# Patient Record
Sex: Female | Born: 1995 | Hispanic: Yes | Marital: Single | Smoking: Never smoker
Health system: Southern US, Community
[De-identification: ages and names within clinical notes are randomized; demographics above are authoritative.]

## PROBLEM LIST (undated history)

## (undated) ENCOUNTER — Inpatient Hospital Stay (HOSPITAL_COMMUNITY): Payer: Self-pay

## (undated) DIAGNOSIS — Z789 Other specified health status: Secondary | ICD-10-CM

## (undated) HISTORY — PX: NO PAST SURGERIES: SHX2092

---
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
## 2017-09-21 ENCOUNTER — Inpatient Hospital Stay (HOSPITAL_COMMUNITY)
Admission: RE | Admit: 2017-09-21 | Discharge: 2017-09-21 | Disposition: A | Discharge status: Home / Self Care | Payer: Medicaid Other | Source: Ambulatory Visit | Attending: Family Medicine | Admitting: Family Medicine

## 2017-09-21 ENCOUNTER — Encounter (HOSPITAL_COMMUNITY): Payer: Self-pay

## 2017-09-21 DIAGNOSIS — Z3A01 Less than 8 weeks gestation of pregnancy: Secondary | ICD-10-CM

## 2017-09-21 DIAGNOSIS — O219 Vomiting of pregnancy, unspecified: Secondary | ICD-10-CM

## 2017-09-21 DIAGNOSIS — O26891 Other specified pregnancy related conditions, first trimester: Secondary | ICD-10-CM | POA: Insufficient documentation

## 2017-09-21 DIAGNOSIS — K5901 Slow transit constipation: Secondary | ICD-10-CM

## 2017-09-21 HISTORY — DX: Other specified health status: Z78.9

## 2017-09-21 LAB — URINALYSIS, ROUTINE W REFLEX MICROSCOPIC
BACTERIA UA: NONE SEEN
BILIRUBIN URINE: NEGATIVE
GLUCOSE, UA: NEGATIVE mg/dL
HGB URINE DIPSTICK: NEGATIVE
KETONES UR: NEGATIVE mg/dL
LEUKOCYTES UA: NEGATIVE
NITRITE: NEGATIVE
PH: 7 (ref 5.0–8.0)
Protein, ur: 100 mg/dL — AB
Specific Gravity, Urine: 1.017 (ref 1.005–1.030)

## 2017-09-21 LAB — POCT PREGNANCY, URINE: Preg Test, Ur: POSITIVE — AB

## 2017-09-21 MED ORDER — METOCLOPRAMIDE HCL 10 MG PO TABS: 10.0000 mg | ORAL_TABLET | Freq: Four times a day (QID) | ORAL | 0 refills | Status: AC | PRN

## 2017-09-21 MED ORDER — POLYETHYLENE GLYCOL 3350 17 GM/SCOOP PO POWD: 17.0000 g | Freq: Every day | ORAL | 0 refills | Status: AC

## 2017-09-21 NOTE — MAU Provider Note (Signed)
History     CSN: [id number]  Arrival date and time: 09/21/17 [id number]   First Provider Initiated Contact with Patient 09/21/17 2055      Chief Complaint  Patient presents with  . Constipation   G1 @[redacted]w[redacted]d  here with N/V and constipation. N/V started 3 days ago. She is vomiting about 3 times a day. She is able to tolerate some fluids. No BM in 2 days. Denies VB or pain.    OB History    Gravida Para Term Preterm AB Living   1             SAB TAB Ectopic Multiple Live Births                  Past Medical History:  Diagnosis Date  . Medical history non-contributory     Past Surgical History:  Procedure Laterality Date  . NO PAST SURGERIES      Family History  Problem Relation Age of Onset  . Diabetes Mother   . Asthma Brother     Social History   Tobacco Use  . Smoking status: Never Smoker  . Smokeless tobacco: Never Used  Substance Use Topics  . Alcohol use: No  . Drug use: No    Allergies:  Allergies  Allergen Reactions  . Pineapple Hives    No medications prior to admission.    Review of Systems  Gastrointestinal: Positive for constipation, nausea and vomiting. Negative for abdominal pain.  Genitourinary: Negative for vaginal bleeding.   Physical Exam   Blood pressure 117/68, pulse 77, temperature 97.6 F (36.4 C), temperature source Oral, resp. rate 18, height 5\' 1"  (1.549 m), weight 116 lb (52.6 kg), last menstrual period 08/12/2017, SpO2 99 %.  Physical Exam  Nursing note and vitals reviewed. Constitutional: She is oriented to person, place, and time. She appears well-developed and well-nourished. No distress.  HENT:  Head: Normocephalic.  Neck: Normal range of motion.  Cardiovascular: Normal rate.  Respiratory: Effort normal. No respiratory distress.  GI: Soft. Bowel sounds are normal. She exhibits no distension and no mass. There is no tenderness. There is no rebound and no guarding.  Musculoskeletal: Normal range of motion.  Neurological:  She is alert and oriented to person, place, and time.  Skin: Skin is warm and dry.  Psychiatric: She has a normal mood and affect.   Results for orders placed or performed during the hospital encounter of 09/21/17 (from the past 24 hour(s))  Urinalysis, Routine w reflex microscopic     Status: Abnormal   Collection Time: 09/21/17  8:11 PM  Result Value Ref Range   Color, Urine YELLOW YELLOW   APPearance TURBID (A) CLEAR   Specific Gravity, Urine 1.017 1.005 - 1.030   pH 7.0 5.0 - 8.0   Glucose, UA NEGATIVE NEGATIVE mg/dL   Hgb urine dipstick NEGATIVE NEGATIVE   Bilirubin Urine NEGATIVE NEGATIVE   Ketones, ur NEGATIVE NEGATIVE mg/dL   Protein, ur 119100 (A) NEGATIVE mg/dL   Nitrite NEGATIVE NEGATIVE   Leukocytes, UA NEGATIVE NEGATIVE   RBC / HPF 6-30 0 - 5 RBC/hpf   WBC, UA TOO NUMEROUS TO COUNT 0 - 5 WBC/hpf   Bacteria, UA NONE SEEN NONE SEEN   Squamous Epithelial / LPF 6-30 (A) NONE SEEN   WBC Clumps PRESENT    Mucus PRESENT   Pregnancy, urine POC     Status: Abnormal   Collection Time: 09/21/17  8:20 PM  Result Value Ref Range   Preg Test,  Ur POSITIVE (A) NEGATIVE   MAU Course  Procedures  MDM Labs ordered and reviewed. No dehydration noted. Not currently nauseated. Will Rx Reglan and Miralax. Stable for discharge home.  Assessment and Plan   1. [redacted] weeks gestation of pregnancy   2. Nausea/vomiting in pregnancy   3. Slow transit constipation    Discharge home Follow up with OBGYN provider of choice in 5 weeks to start care Rx Reglan Rx Miralax Maintain hydration HEG diet  Allergies as of 09/21/2017      Reactions   Pineapple Hives      Medication List    STOP taking these medications   ferrous sulfate 325 (65 FE) MG tablet     TAKE these medications   metoCLOPramide 10 MG tablet Commonly known as:  REGLAN Take 1 tablet (10 mg total) by mouth every 6 (six) hours as needed for nausea.   polyethylene glycol powder powder Commonly known as:   GLYCOLAX/MIRALAX Take 17 g by mouth daily.       Donette Larry 09/21/2017, 9:43 PM

## 2017-09-21 NOTE — MAU Note (Signed)
Pt here with c/o constipation for past two days, nausea, vomiting that started today. Denies any bleeding. Denies any pain.

## 2017-09-21 NOTE — Discharge Instructions (Signed)
Constipation, Adult Constipation is when a person has fewer bowel movements in a week than normal, has difficulty having a bowel movement, or has stools that are dry, hard, or larger than normal. Constipation may be caused by an underlying condition. It may become worse with age if a person takes certain medicines and does not take in enough fluids. Follow these instructions at home: Eating and drinking   Eat foods that have a lot of fiber, such as fresh fruits and vegetables, whole grains, and beans.  Limit foods that are high in fat, low in fiber, or overly processed, such as french fries, hamburgers, cookies, candies, and soda.  Drink enough fluid to keep your urine clear or pale yellow. General instructions  Exercise regularly or as told by your health care provider.  Go to the restroom when you have the urge to go. Do not hold it in.  Take over-the-counter and prescription medicines only as told by your health care provider. These include any fiber supplements.  Practice pelvic floor retraining exercises, such as deep breathing while relaxing the lower abdomen and pelvic floor relaxation during bowel movements.  Watch your condition for any changes.  Keep all follow-up visits as told by your health care provider. This is important. Contact a health care provider if:  You have pain that gets worse.  You have a fever.  You do not have a bowel movement after 4 days.  You vomit.  You are not hungry.  You lose weight.  You are bleeding from the anus.  You have thin, pencil-like stools. Get help right away if:  You have a fever and your symptoms suddenly get worse.  You leak stool or have blood in your stool.  Your abdomen is bloated.  You have severe pain in your abdomen.  You feel dizzy or you faint. This information is not intended to replace advice given to you by your health care provider. Make sure you discuss any questions you have with your health care  provider. Document Released: 06/05/2004 Document Revised: 03/27/2016 Document Reviewed: 02/26/2016 Elsevier Interactive Patient Education  2018 [employer].  Hyperemesis Gravidarum Hyperemesis gravidarum is a severe form of nausea and vomiting that happens during pregnancy. Hyperemesis is worse than morning sickness. It may cause you to have nausea or vomiting all day for many days. It may keep you from eating and drinking enough food and liquids. Hyperemesis usually occurs during the first half (the first 20 weeks) of pregnancy. It often goes away once a woman is in her second half of pregnancy. However, sometimes hyperemesis continues through an entire pregnancy. What are the causes? The cause of this condition is not known. It may be related to changes in chemicals (hormones) in the body during pregnancy, such as the high level of pregnancy hormone (human chorionic gonadotropin) or the increase in the female sex hormone (estrogen). What are the signs or symptoms? Symptoms of this condition include:  Severe nausea and vomiting.  Nausea that does not go away.  Vomiting that does not allow you to keep any food down.  Weight loss.  Body fluid loss (dehydration).  Having no desire to eat, or not liking food that you have previously enjoyed.  How is this diagnosed? This condition may be diagnosed based on:  A physical exam.  Your medical history.  Your symptoms.  Blood tests.  Urine tests.  How is this treated? This condition may be managed with medicine. If medicines to do not help relieve  nausea and vomiting, you may need to receive fluids through an IV tube at the hospital. Follow these instructions at home:  Take over-the-counter and prescription medicines only as told by your health care provider.  Avoid iron pills and multivitamins that contain iron for the first 3-4 months of pregnancy. If you take prescription iron pills, do not stop taking them unless your health  care provider approves.  Take the following actions to help prevent nausea and vomiting: ? In the morning, before getting out of bed, try eating a couple of dry crackers or a piece of toast. ? Avoid foods and smells that upset your stomach. Fatty and spicy foods may make nausea worse. ? Eat 5-6 small meals a day. ? Do not drink fluids while eating meals. Drink between meals. ? Eat or suck on things that have ginger in them. Ginger can help relieve nausea. ? Avoid food preparation. The smell of food can spoil your appetite or trigger nausea.  Follow instructions from your health care provider about eating or drinking restrictions.  For snacks, eat high-protein foods, such as cheese.  Keep all follow-up and pre-birth (prenatal) visits as told by your health care provider. This is important. Contact a health care provider if:  You have pain in your abdomen.  You have a severe headache.  You have vision problems.  You are losing weight. Get help right away if:  You cannot drink fluids without vomiting.  You vomit blood.  You have constant nausea and vomiting.  You are very weak.  You are very thirsty.  You feel dizzy.  You faint.  You have a fever or other symptoms that last for more than 2-3 days.  You have a fever and your symptoms suddenly get worse. Summary  Hyperemesis gravidarum is a severe form of nausea and vomiting that happens during pregnancy.  Making some changes to your eating habits may help relieve nausea and vomiting.  This condition may be managed with medicine.  If medicines to do not help relieve nausea and vomiting, you may need to receive fluids through an IV tube at the hospital. This information is not intended to replace advice given to you by your health care provider. Make sure you discuss any questions you have with your health care provider. Document Released: 09/07/2005 Document Revised: 05/06/2016 Document Reviewed: 05/06/2016 Elsevier  Interactive Patient Education  2017 [employer] Eating Plan for Hyperemesis Gravidarum Hyperemesis gravidarum is a severe form of morning sickness. Because this condition causes severe nausea and vomiting, it can lead to dehydration, malnutrition, and weight loss. One way to lessen the symptoms of nausea and vomiting is to follow the eating plan for hyperemesis gravidarum. It is often used along with prescribed medicines to control your symptoms. What can I do to relieve my symptoms? Listen to your body. Everyone is different and has different preferences. Find what works best for you. Take any of the following actions that are helpful to you:  Eat and drink slowly.  Eat 5-6 small meals daily instead of 3 large meals.  Eat crackers before you get out of bed in the morning.  Try having a snack in the middle of the night.  Starchy foods are usually tolerated well. Examples include cereal, toast, bread, potatoes, pasta, rice, and pretzels.  Ginger may help with nausea. Add  tsp ground ginger to hot tea or choose ginger tea.  Try drinking 100% fruit juice or an electrolyte drink. An electrolyte drink contains sodium, potassium, and  chloride.  Continue to take your prenatal vitamins as told by your health care provider. If you are having trouble taking your prenatal vitamins, talk with your health care provider about different options.  Include at least 1 serving of protein with your meals and snacks. Protein options include meats or poultry, beans, nuts, eggs, and yogurt. Try eating a protein-rich snack before bed. Examples of these snacks include cheese and crackers or half of a peanut butter or [place] sandwich.  Consider eliminating foods that trigger your symptoms. These may include spicy foods, coffee, high-fat foods, very sweet foods, and acidic foods.  Try meals that have more protein combined with bland, salty, lower-fat, and dry foods, such as nuts, seeds, pretzels, crackers, and  cereal.  Talk with your healthcare provider about starting a supplement of vitamin B6.  Have fluids that are cold, clear, and carbonated or sour. Examples include lemonade, ginger ale, lemon-lime soda, ice water, and sparkling water.  Try lemon or mint tea.  Try brushing your teeth or using a mouth rinse after meals.  What should I avoid to reduce my symptoms? Avoiding some of the following things may help reduce your symptoms.  Foods with strong smells. Try eating meals in well-ventilated areas that are free of odors.  Drinking water or other beverages with meals. Try not to drink anything during the 30 minutes before and after your meals.  Drinking more than 1 cup of fluid at a time. Sometimes using a straw helps.  Fried or high-fat foods, such as butter and cream sauces.  Spicy foods.  Skipping meals as best as you can. Nausea can be more intense on an empty stomach. If you cannot tolerate food at that time, do not force it. Try sucking on ice chips or other frozen items, and make up for missed calories later.  Lying down within 2 hours after eating.  Environmental triggers. These may include smoky rooms, closed spaces, rooms with strong smells, warm or humid places, overly loud and noisy rooms, and rooms with motion or flickering lights.  Quick and sudden changes in your movement.  This information is not intended to replace advice given to you by your health care provider. Make sure you discuss any questions you have with your health care provider. Document Released: 07/05/2007 Document Revised: 05/06/2016 Document Reviewed: 04/07/2016 Elsevier Interactive Patient Education  [employer].

## 2017-10-28 ENCOUNTER — Ambulatory Visit (INDEPENDENT_AMBULATORY_CARE_PROVIDER_SITE_OTHER): Payer: Self-pay | Admitting: Student

## 2017-10-28 ENCOUNTER — Encounter: Payer: Self-pay | Admitting: Student

## 2017-10-28 VITALS — BP 114/70 | HR 93 | Wt 111.5 lb

## 2017-10-28 DIAGNOSIS — Z23 Encounter for immunization: Secondary | ICD-10-CM

## 2017-10-28 DIAGNOSIS — Z3401 Encounter for supervision of normal first pregnancy, first trimester: Secondary | ICD-10-CM

## 2017-10-28 DIAGNOSIS — Z3400 Encounter for supervision of normal first pregnancy, unspecified trimester: Secondary | ICD-10-CM

## 2017-10-28 DIAGNOSIS — Z113 Encounter for screening for infections with a predominantly sexual mode of transmission: Secondary | ICD-10-CM

## 2017-10-28 LAB — POCT URINALYSIS DIP (DEVICE)
Bilirubin Urine: NEGATIVE
GLUCOSE, UA: NEGATIVE mg/dL
Hgb urine dipstick: NEGATIVE
KETONES UR: NEGATIVE mg/dL
Leukocytes, UA: NEGATIVE
Nitrite: NEGATIVE
PROTEIN: NEGATIVE mg/dL
Specific Gravity, Urine: 1.02 (ref 1.005–1.030)
Urobilinogen, UA: 1 mg/dL (ref 0.0–1.0)
pH: 7 (ref 5.0–8.0)

## 2017-10-28 MED ORDER — PRENATAL VITAMINS 0.8 MG PO TABS: 1.0000 | ORAL_TABLET | Freq: Every day | ORAL | 7 refills | Status: AC

## 2017-10-28 NOTE — Progress Notes (Signed)
  Subjective:    Meagan Ramirez is being seen today for her first obstetrical visit.  This is a planned pregnancy. She is at 4073w0d gestation. Her obstetrical history is significant for nothing. . Relationship with FOB: in a committed relationship but not married or living together. . Patient does intend to breast feed. Pregnancy history fully reviewed.  Patient reports no complaints.  Review of Systems:   Review of Systems  Constitutional: Negative.   HENT: Negative.   Respiratory: Negative.   Cardiovascular: Negative.   Gastrointestinal: Negative.   Genitourinary: Negative.   Musculoskeletal: Negative.   Neurological: Negative.     Objective:     BP 114/70   Pulse 93   Wt 111 lb 8 oz (50.6 kg)   LMP 08/12/2017 (Approximate)   BMI 21.07 kg/m  Physical Exam  Constitutional: She is oriented to person, place, and time. She appears well-developed.  HENT:  Head: Normocephalic.  Neck: Normal range of motion.  Respiratory: Effort normal.  GI: Soft.  Musculoskeletal: Normal range of motion.  Neurological: She is alert and oriented to person, place, and time.  Skin: Skin is warm and dry.  Psychiatric: She has a normal mood and affect.    Exam    Assessment:    Pregnancy: G1P0 Patient Active Problem List   Diagnosis Date Noted  . Supervision of normal first pregnancy, antepartum 10/28/2017  . Post traumatic stress disorder (PTSD) 03/10/2014       Plan:     Initial labs drawn. Prenatal vitamins. Problem list reviewed and updated. AFP3 discussed: will do panorama. Role of ultrasound in pregnancy discussed; fetal survey: will order at next visit. Amniocentesis discussed: not indicated. Follow up in 4 weeks. 75% of 45 min visit spent on counseling and coordination of care.  -Pap smear postpartum -Oriented patient to practice -Reviewed bc options and discussed breastfeeding.  -Enrolled in [employer] [place] CNM 10/28/2017

## 2017-10-28 NOTE — Patient Instructions (Signed)

## 2017-10-30 LAB — URINE CULTURE, OB REFLEX

## 2017-10-30 LAB — CULTURE, OB URINE

## 2017-11-01 LAB — GC/CHLAMYDIA PROBE AMP (~~LOC~~) NOT AT ARMC
CHLAMYDIA, DNA PROBE: NEGATIVE
Neisseria Gonorrhea: NEGATIVE

## 2017-11-04 ENCOUNTER — Encounter: Payer: Self-pay | Admitting: *Deleted

## 2017-11-04 LAB — OBSTETRIC PANEL, INCLUDING HIV
Antibody Screen: NEGATIVE
Basophils Absolute: 0 10*3/uL (ref 0.0–0.2)
Basos: 0 %
EOS (ABSOLUTE): 0.1 10*3/uL (ref 0.0–0.4)
EOS: 1 %
HEMOGLOBIN: 11.5 g/dL (ref 11.1–15.9)
HIV SCREEN 4TH GENERATION: NONREACTIVE
Hematocrit: 34.7 % (ref 34.0–46.6)
Hepatitis B Surface Ag: NEGATIVE
IMMATURE GRANULOCYTES: 0 %
Immature Grans (Abs): 0 10*3/uL (ref 0.0–0.1)
LYMPHS ABS: 1.6 10*3/uL (ref 0.7–3.1)
Lymphs: 23 %
MCH: 26.7 pg (ref 26.6–33.0)
MCHC: 33.1 g/dL (ref 31.5–35.7)
MCV: 81 fL (ref 79–97)
MONOS ABS: 0.6 10*3/uL (ref 0.1–0.9)
Monocytes: 8 %
NEUTROS ABS: 4.5 10*3/uL (ref 1.4–7.0)
NEUTROS PCT: 68 %
PLATELETS: 196 10*3/uL (ref 150–379)
RBC: 4.3 x10E6/uL (ref 3.77–5.28)
RDW: 16 % — ABNORMAL HIGH (ref 12.3–15.4)
RH TYPE: POSITIVE
RPR: NONREACTIVE
Rubella Antibodies, IGG: <0.9 index — ABNORMAL LOW (ref >0.99)
WBC: 6.7 10*3/uL (ref 3.4–10.8)

## 2017-11-04 LAB — SMN1 COPY NUMBER ANALYSIS (SMA CARRIER SCREENING)

## 2017-11-04 LAB — HEMOGLOBINOPATHY EVALUATION
Ferritin: 13 ng/mL — ABNORMAL LOW (ref 15–150)
HGB A2 QUANT: 2.1 % (ref 1.8–3.2)
HGB A: 97.9 % (ref 96.4–98.8)
HGB VARIANT: 0 %
Hgb C: 0 %
Hgb F Quant: 0 % (ref 0.0–2.0)
Hgb S: 0 %
Hgb Solubility: NEGATIVE

## 2017-11-05 ENCOUNTER — Encounter: Payer: Self-pay | Admitting: Student

## 2017-11-05 DIAGNOSIS — Z283 Underimmunization status: Secondary | ICD-10-CM | POA: Insufficient documentation

## 2017-11-05 DIAGNOSIS — O09899 Supervision of other high risk pregnancies, unspecified trimester: Secondary | ICD-10-CM | POA: Insufficient documentation

## 2017-11-05 DIAGNOSIS — O9989 Other specified diseases and conditions complicating pregnancy, childbirth and the puerperium: Secondary | ICD-10-CM

## 2017-11-24 ENCOUNTER — Ambulatory Visit (INDEPENDENT_AMBULATORY_CARE_PROVIDER_SITE_OTHER): Payer: Self-pay | Admitting: Medical

## 2017-11-24 VITALS — BP 115/66 | HR 96 | Wt 113.9 lb

## 2017-11-24 DIAGNOSIS — Z3402 Encounter for supervision of normal first pregnancy, second trimester: Secondary | ICD-10-CM

## 2017-11-24 DIAGNOSIS — Z3689 Encounter for other specified antenatal screening: Secondary | ICD-10-CM

## 2017-11-24 DIAGNOSIS — Z3400 Encounter for supervision of normal first pregnancy, unspecified trimester: Secondary | ICD-10-CM

## 2017-11-24 NOTE — Progress Notes (Signed)
   PRENATAL VISIT NOTE  Subjective:  Meagan Ramirez is a 22 y.o. G1P0 at 2231w6d being seen today for ongoing prenatal care.  She is currently monitored for the following issues for this low-risk pregnancy and has Post traumatic stress disorder (PTSD); Supervision of normal first pregnancy, antepartum; and Rubella non-immune status, antepartum on their problem list.  Patient reports heartburn and occasional round ligament pain.  Contractions: Not present. Vag. Bleeding: None.  Movement: Present. Denies leaking of fluid.   The following portions of the patient's history were reviewed and updated as appropriate: allergies, current medications, past family history, past medical history, past social history, past surgical history and problem list. Problem list updated.  Objective:   Vitals:   11/24/17 1429  BP: 115/66  Pulse: 96  Weight: 113 lb 14.4 oz (51.7 kg)    Fetal Status: Fetal Heart Rate (bpm): 152   Movement: Present     General:  Alert, oriented and cooperative. Patient is in no acute distress.  Skin: Skin is warm and dry. No rash noted.   Cardiovascular: Normal heart rate noted  Respiratory: Normal respiratory effort, no problems with respiration noted  Abdomen: Soft, gravid, appropriate for gestational age.  Pain/Pressure: Present     Pelvic: Cervical exam deferred        Extremities: Normal range of motion.  Edema: None  Mental Status:  Normal mood and affect. Normal behavior. Normal judgment and thought content.   Assessment and Plan:  Pregnancy: G1P0 at 4731w6d  1. Supervision of normal first pregnancy, antepartum - US MFM OB COMP + 14 WK; scheduled  2. Heartburn in pregnancy, second trimester - GERD diet discussed and included on her AVS - TUMs recommended first for symptomatic relief   Second trimester warning symptoms and general obstetric precautions including but not limited to vaginal bleeding, contractions, leaking of fluid and fetal movement were  reviewed in detail with the patient. Please refer to After Visit Summary for other counseling recommendations.  Return in about 4 weeks (around 12/22/2017) for LOB.   Vonzella NippleJulie Nyomie Ehrlich, PA-C

## 2017-11-24 NOTE — Patient Instructions (Addendum)
Second Trimester of Pregnancy The second trimester is from week 13 through week 28, month 4 through 6. This is often the time in pregnancy that you feel your best. Often times, morning sickness has lessened or quit. You may have more energy, and you may get hungry more often. Your unborn baby (fetus) is growing rapidly. At the end of the sixth month, he or she is about 9 inches long and weighs about 1 pounds. You will likely feel the baby move (quickening) between 18 and 20 weeks of pregnancy. Follow these instructions at home:  Avoid all smoking, herbs, and alcohol. Avoid drugs not approved by your doctor.  Do not use any tobacco products, including cigarettes, chewing tobacco, and electronic cigarettes. If you need help quitting, ask your doctor. You may get counseling or other support to help you quit.  Only take medicine as told by your doctor. Some medicines are safe and some are not during pregnancy.  Exercise only as told by your doctor. Stop exercising if you start having cramps.  Eat regular, healthy meals.  Wear a good support bra if your breasts are tender.  Do not use hot tubs, steam rooms, or saunas.  Wear your seat belt when driving.  Avoid raw meat, uncooked cheese, and liter boxes and soil used by cats.  Take your prenatal vitamins.  Take 1500-2000 milligrams of calcium daily starting at the 20th week of pregnancy until you deliver your baby.  Try taking medicine that helps you poop (stool softener) as needed, and if your doctor approves. Eat more fiber by eating fresh fruit, vegetables, and whole grains. Drink enough fluids to keep your pee (urine) clear or pale yellow.  Take warm water baths (sitz baths) to soothe pain or discomfort caused by hemorrhoids. Use hemorrhoid cream if your doctor approves.  If you have puffy, bulging veins (varicose veins), wear support hose. Raise (elevate) your feet for 15 minutes, 3-4 times a day. Limit salt in your diet.  Avoid heavy  lifting, wear low heals, and sit up straight.  Rest with your legs raised if you have leg cramps or low back pain.  Visit your dentist if you have not gone during your pregnancy. Use a soft toothbrush to brush your teeth. Be gentle when you floss.  You can have sex (intercourse) unless your doctor tells you not to.  Go to your doctor visits. Get help if:  You feel dizzy.  You have mild cramps or pressure in your lower belly (abdomen).  You have a nagging pain in your belly area.  You continue to feel sick to your stomach (nauseous), throw up (vomit), or have watery poop (diarrhea).  You have bad smelling fluid coming from your vagina.  You have pain with peeing (urination). Get help right away if:  You have a fever.  You are leaking fluid from your vagina.  You have spotting or bleeding from your vagina.  You have severe belly cramping or pain.  You lose or gain weight rapidly.  You have trouble catching your breath and have chest pain.  You notice sudden or extreme puffiness (swelling) of your face, hands, ankles, feet, or legs.  You have not felt the baby move in over an hour.  You have severe headaches that do not go away with medicine.  You have vision changes. This information is not intended to replace advice given to you by your health care provider. Make sure you discuss any questions you have with your health care  provider. Document Released: 12/02/2009 Document Revised: 02/13/2016 Document Reviewed: 11/08/2012 Elsevier Interactive Patient Education  2017 [place] Education Options: [facility] Department Classes:  Childbirth education classes can help you get ready for a positive parenting experience. You can also meet other expectant parents and get free stuff for your baby. Each class runs for five weeks on the same night and costs $45 for the mother-to-be and her support person. Medicaid covers the cost if you are eligible.  Call [phone] to register. [facility] Childbirth Education:  [phone] or [phone] or [email or web address]  Baby & Me Class: Discuss newborn & infant parenting and family adjustment issues with other new mothers in a relaxed environment. Each week brings a new speaker or baby-centered activity. We encourage new mothers to join [place] every Thursday at 11:00am. Babies birth until crawling. No registration or fee. Daddy [employer]: This course offers Dads-to-be the tools and knowledge needed to feel confident on their journey to becoming new fathers. Experienced dads, who have been trained as coaches, teach dads-to-be how to hold, comfort, diaper, swaddle and play with their infant while being able to support the new mom as well. A class for men taught by men. $25/dad Big Brother/Big Sister: Let your children share in the joy of a new brother or sister in this special class designed just for them. Class includes discussion about how families care for babies: swaddling, holding, diapering, safety as well as how they can be helpful in their new role. This class is designed for children ages 67 to 18, but any age is welcome. Please register each child individually. $5/child  Mom Talk: This mom-led group offers support and connection to mothers as they journey through the adjustments and struggles of that sometimes overwhelming first year after the birth of a child. Tuesdays at 10:00am and Thursdays at 6:00pm. Babies welcome. No registration or fee. Breastfeeding Support Group: This group is a mother-to-mother support circle where moms have the opportunity to share their breastfeeding experiences. A Lactation Consultant is present for questions and concerns. Meets each Tuesday at 11:00am. No fee or registration. Breastfeeding Your Baby: Learn what to expect in the first days of breastfeeding your newborn.  This class will help you feel more confident with the skills needed to begin your  breastfeeding experience. Many new mothers are concerned about breastfeeding after leaving the hospital. This class will also address the most common fears and challenges about breastfeeding during the first few weeks, months and beyond. (call for fee) Comfort Techniques and Tour: This 2 hour interactive class will provide you the opportunity to learn & practice hands-on techniques that can help relieve some of the discomfort of labor and encourage your baby to rotate toward the best position for birth. You and your partner will be able to try a variety of labor positions with birth balls and rebozos as well as practice breathing, relaxation, and visualization techniques. A tour of the [facility] is included with this class. $20 per registrant and support person Childbirth Class- Weekend Option: This class is a Weekend version of our Birth & Baby series. It is designed for parents who have a difficult time fitting several weeks of classes into their schedule. It covers the care of your newborn and the basics of labor and childbirth. It also includes a [facility] and lunch. The class is held two consecutive days: beginning on Friday evening from 6:30 - 8:30 p.m. and the  the next day, Saturday from 9 a.m. - 4 p.m. (call for fee) Waterbirth Class: Interested in a waterbirth?  This informational class will help you discover whether waterbirth is the right fit for you. Education about waterbirth itself, supplies you would need and how to assemble your support team is what you can expect from this class. Some obstetrical practices require this class in order to pursue a waterbirth. (Not all obstetrical practices offer waterbirth-check with your healthcare provider.) Register only the expectant mom, but you are encouraged to bring your partner to class! Required if planning waterbirth, no fee. Infant/Child CPR: Parents, grandparents, babysitters, and friends  learn Cardio-Pulmonary Resuscitation skills for infants and children. You will also learn how to treat both conscious and unconscious choking in infants and children. This Family & Friends program does not offer certification. Register each participant individually to ensure that enough mannequins are available. (Call for fee) Grandparent Love: Expecting a grandbaby? This class is for you! Learn about the latest infant care and safety recommendations and ways to support your own child as he or she transitions into the parenting role. Taught by Registered Nurses who are childbirth instructors, but most importantly...they are grandmothers too! $10/person. Childbirth Class- Natural Childbirth: This series of 5 weekly classes is for expectant parents who want to learn and practice natural methods of coping with the process of labor and childbirth. Relaxation, breathing, massage, visualization, role of the partner, and helpful positioning are highlighted. Participants learn how to be confident in their body's ability to give birth. This class will empower and help parents make informed decisions about their own care. Includes discussion that will help new parents transition into the immediate postpartum period. Maternity Care Center Tour of [facility] is included. We suggest taking this class between 25-32 weeks, but it's only a recommendation. $75 per registrant and one support person or $30 Medicaid. Childbirth Class- 3 week Series: This option of 3 weekly classes helps you and your labor partner prepare for childbirth. Newborn care, labor & birth, cesarean birth, pain management, and comfort techniques are discussed and a Maternity Care Center Tour of [facility] is included. The class meets at the same time, on the same day of the week for 3 consecutive weeks beginning with the starting date you choose. $60 for registrant and one support person.  Marvelous Multiples: Expecting twins, triplets, or more?  This class covers the differences in labor, birth, parenting, and breastfeeding issues that face multiples' parents. NICU tour is included. Led by a Certified Childbirth Educator who is the mother of twins. No fee. Caring for Baby: This class is for expectant and adoptive parents who want to learn and practice the most up-to-date newborn care for their babies. Focus is on birth through the first six weeks of life. Topics include feeding, bathing, diapering, crying, umbilical cord care, circumcision care and safe sleep. Parents learn to recognize symptoms of illness and when to call the pediatrician. Register only the mom-to-be and your partner or support person can plan to come with you! $10 per registrant and support person Childbirth Class- online option: This online class offers you the freedom to complete a Birth and Baby series in the comfort of your own home. The flexibility of this option allows you to review sections at your own pace, at times convenient to you and your support people. It includes additional video information, animations, quizzes, and extended activities. Get organized with helpful eClass tools, checklists, and trackers. Once you register online for the   you will receive an email within a few days to accept the invitation and begin the class when the time is right for you. The content will be available to you for 60 days. $60 for 60 days of online access for you and your support people.  Local Doulas: Natural Baby Doulas [email or web address] Tel: [phone] [email or web address] [employer] [phone] [email or web address] The Labor [facility]ell Halim  (also do waterbirth tub rental) [phone] [email or web address] [facility] Doula [phone] [email or web address].fi Sacred Rhythms  [phone] [email or web address] [employer] (PADA) [email or web address] [place] [facility]  [email or web address] Considering Waterbirth? Guide for patients at [facility] [employer]  Why consider waterbirth?  . Gentle birth for babies . Less pain medicine used in labor . May allow for passive descent/less pushing . May reduce perineal tears  . More mobility and instinctive maternal position changes . Increased maternal relaxation . Reduced blood pressure in labor  Is waterbirth safe? What are the risks of infection, drowning or other complications?  . Infection: o Very low risk (3.7 % for tub vs 4.8% for bed) o 7 in 8000 waterbirths with documented infection o Poorly cleaned equipment most common cause o Slightly lower group B strep transmission rate  . Drowning o Maternal:  - Very low risk   - Related to seizures or fainting o Newborn:  - Very low risk. No evidence of increased risk of respiratory problems in multiple large studies - Physiological protection from breathing under water - Avoid underwater birth if there are any fetal complications - Once baby's head is out of the water, keep it out.  . Birth complication o Some reports of cord trauma, but risk decreased by bringing baby to surface gradually o No evidence of increased risk of shoulder dystocia. Mothers can usually change positions faster in water than in a bed, possibly aiding the maneuvers to free the shoulder.   You must attend a Doren Custard class at [facility]  3rd Wednesday of every month from 7-9pm  [employer] by calling [phone] or online at [email or web address]  Bring [place] the certificate from the class to your prenatal appointment  Meet with a midwife at 36 weeks to see if you can still plan a waterbirth and to sign the consent.   Purchase or rent the following supplies:   Water Birth Pool (Birth Pool in a Box or [place] for instance)  (Tubs  start ~$125)  Single-use disposable tub liner designed for your brand of tub  New garden hose labeled "lead-free", "suitable for drinking water",  Electric drain pump to remove water (We recommend 792 gallon per hour or greater pump.)   Separate garden hose to remove the dirty water  Fish net  Bathing suit top (optional)  Long-handled mirror (optional)  Places to purchase or rent supplies  [email or web address].is for tub purchases and supplies  Waterbirthsolutions.com for tub purchases and supplies  The Labor Ladies ([email or web address]) $275 for tub rental/set-up & take down/kit   [employer] (http://www.fleming.com/.htm) Information regarding doulas (labor support) who provide pool rentals  Our practice has a Birth Pool in a Box tub at the hospital that you may borrow on a first-come-first-served basis. It is your responsibility to to set up, clean and break down the tub. We cannot guarantee the availability of this tub in advance. You are responsible for bringing all accessories listed above. If you do not have all necessary supplies you cannot have a waterbirth.    Things that would prevent you from  having a waterbirth:  Premature, <37wks  Previous cesarean birth  Presence of thick meconium-stained fluid  Multiple gestation (Twins, triplets, etc.)  Uncontrolled diabetes or gestational diabetes requiring medication  Hypertension requiring medication or diagnosis of pre-eclampsia  Heavy vaginal bleeding  Non-reassuring fetal heart rate  Active infection (MRSA, etc.). Group B Strep is NOT a contraindication for  waterbirth.  If your labor has to be induced and induction method requires continuous  monitoring of the baby's heart rate  Other risks/issues identified by your obstetrical provider  Please remember that birth is unpredictable. Under certain unforeseeable circumstances your provider may advise against giving birth in the tub. These  decisions will be made on a case-by-case basis and with the safety of you and your baby as our highest priority.   Food Choices for Gastroesophageal Reflux Disease, Adult When you have gastroesophageal reflux disease (GERD), the foods you eat and your eating habits are very important. Choosing the right foods can help ease your discomfort. What guidelines do I need to follow?  Choose fruits, vegetables, whole grains, and low-fat dairy products.  Choose low-fat meat, fish, and poultry.  Limit fats such as oils, salad dressings, butter, nuts, and avocado.  Keep a food diary. This helps you identify foods that cause symptoms.  Avoid foods that cause symptoms. These may be different for everyone.  Eat small meals often instead of 3 large meals a day.  Eat your meals slowly, in a place where you are relaxed.  Limit fried foods.  Cook foods using methods other than frying.  Avoid drinking alcohol.  Avoid drinking large amounts of liquids with your meals.  Avoid bending over or lying down until 2-3 hours after eating. What foods are not recommended? These are some foods and drinks that may make your symptoms worse: Vegetables Tomatoes. Tomato juice. Tomato and spaghetti sauce. Chili peppers. Onion and garlic. Horseradish. Fruits Oranges, grapefruit, and lemon (fruit and juice). Meats High-fat meats, fish, and poultry. This includes hot dogs, ribs, ham, sausage, salami, and bacon. Dairy Whole milk and chocolate milk. Sour cream. Cream. Butter. Ice cream. Cream cheese. Drinks Coffee and tea. Bubbly (carbonated) drinks or energy drinks. Condiments Hot sauce. Barbecue sauce. Sweets/Desserts Chocolate and cocoa. Donuts. Peppermint and spearmint. Fats and Oils High-fat foods. This includes [place] fries and potato chips. Other Vinegar. Strong spices. This includes black pepper, white pepper, red pepper, cayenne, curry powder, cloves, ginger, and chili powder. The items listed  above may not be a complete list of foods and drinks to avoid. Contact your dietitian for more information. This information is not intended to replace advice given to you by your health care provider. Make sure you discuss any questions you have with your health care provider. Document Released: 03/08/2012 Document Revised: 02/13/2016 Document Reviewed: 07/12/2013 Elsevier Interactive Patient Education  2017 [employer].

## 2017-12-13 ENCOUNTER — Encounter (HOSPITAL_COMMUNITY): Payer: Self-pay | Admitting: Medical

## 2017-12-17 ENCOUNTER — Ambulatory Visit (HOSPITAL_COMMUNITY)
Admission: RE | Admit: 2017-12-17 | Discharge: 2017-12-17 | Disposition: A | Discharge status: Home / Self Care | Payer: Medicaid Other | Source: Ambulatory Visit | Attending: Medical | Admitting: Medical

## 2017-12-17 DIAGNOSIS — Z3A18 18 weeks gestation of pregnancy: Secondary | ICD-10-CM | POA: Insufficient documentation

## 2017-12-17 DIAGNOSIS — Z3689 Encounter for other specified antenatal screening: Secondary | ICD-10-CM | POA: Diagnosis present

## 2017-12-17 DIAGNOSIS — Z3400 Encounter for supervision of normal first pregnancy, unspecified trimester: Secondary | ICD-10-CM

## 2017-12-17 IMAGING — US US MFM OB COMP +14 WKS
1 series · 14 of 28 positions shown · non-contrast
Comparison: none

[Series 1: us mfm ob comp +14 wks · 14 of 106 slices shown]
[im 4/106]
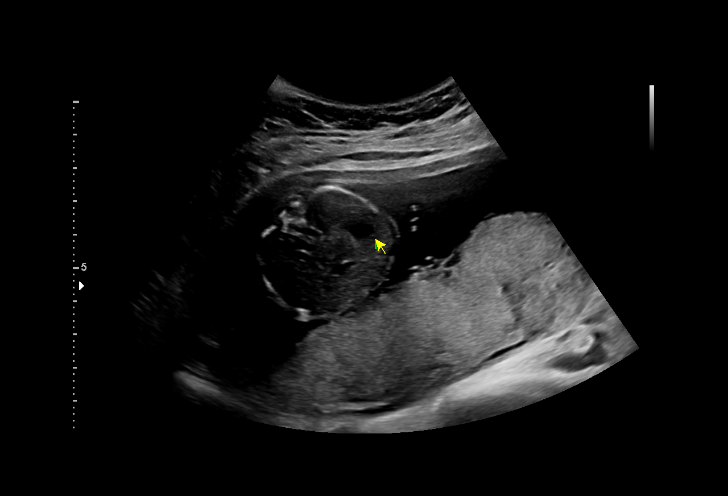
[im 12/106]
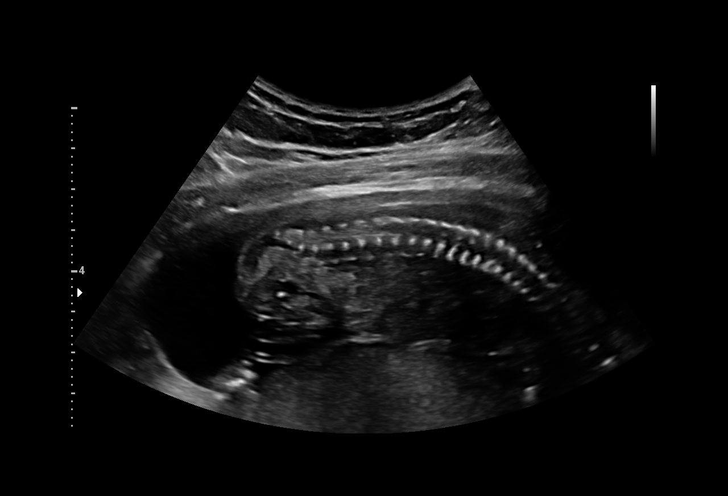
[im 20/106]
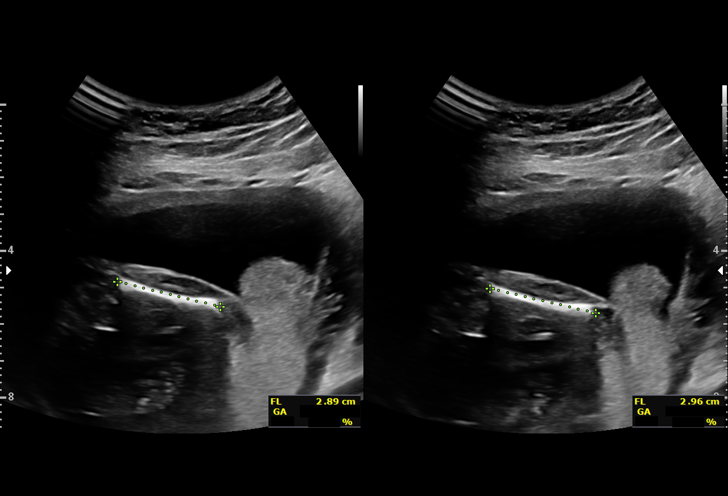
[im 28/106]
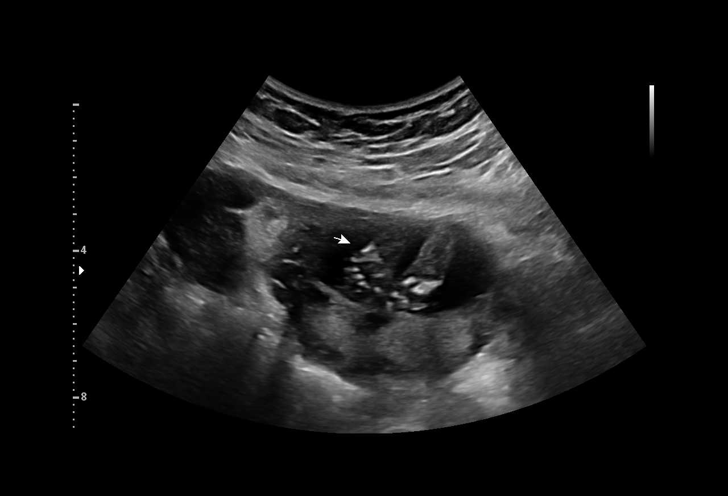
[im 36/106]
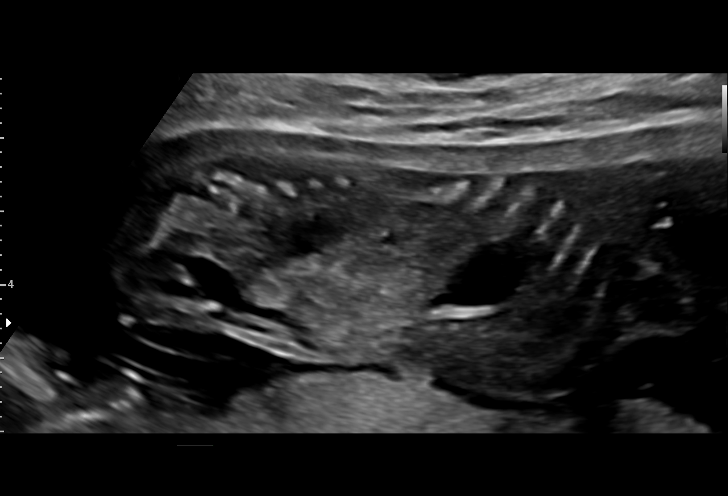
[im 43/106]
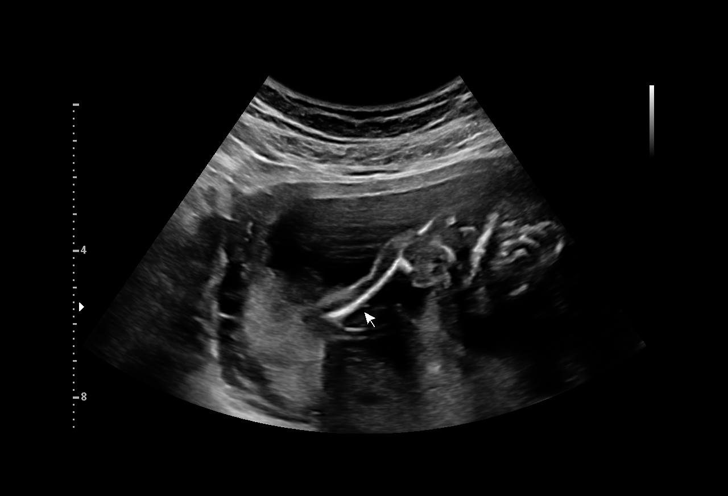
[im 51/106]
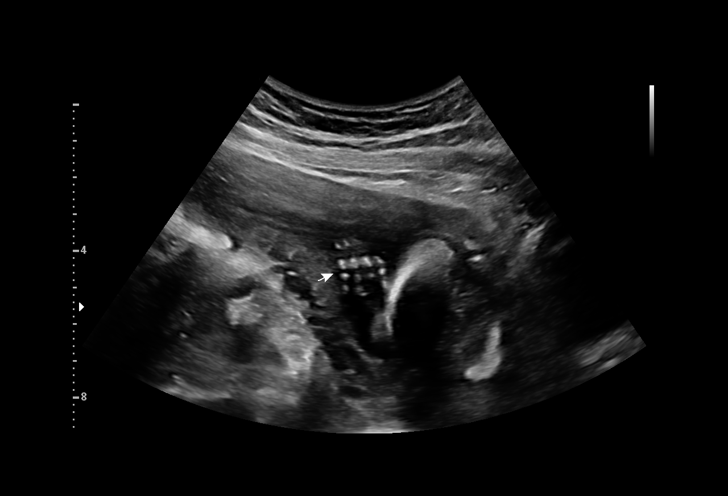
[im 59/106]
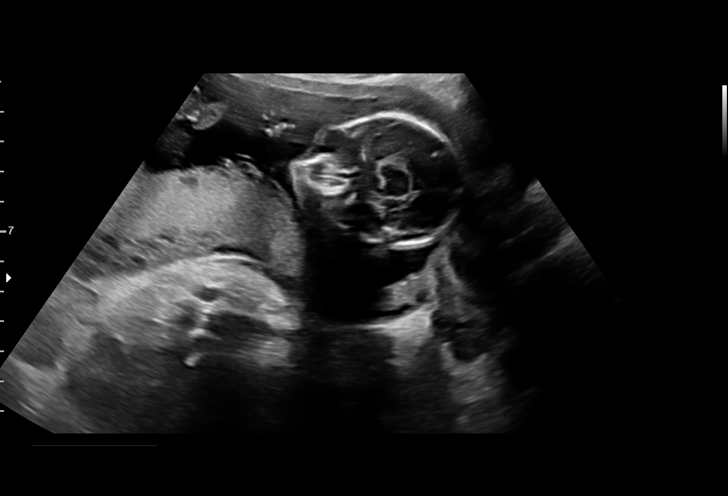
[im 67/106]
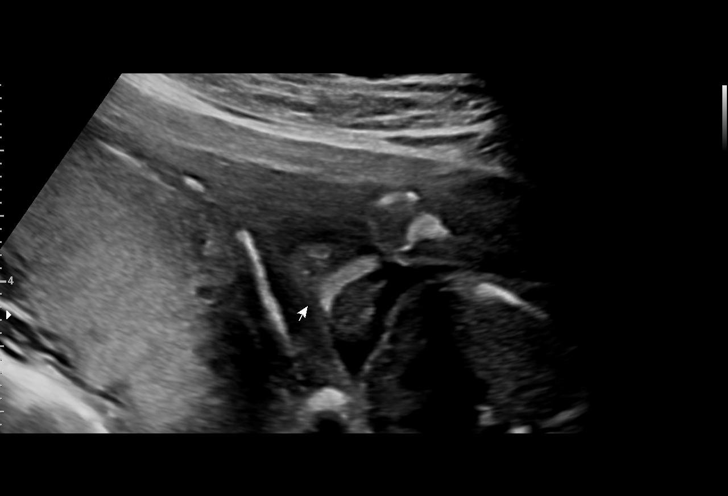
[im 74/106]
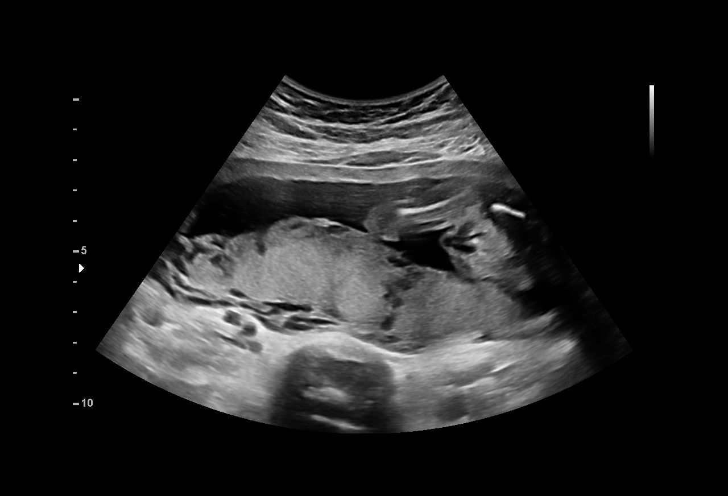
[im 82/106]
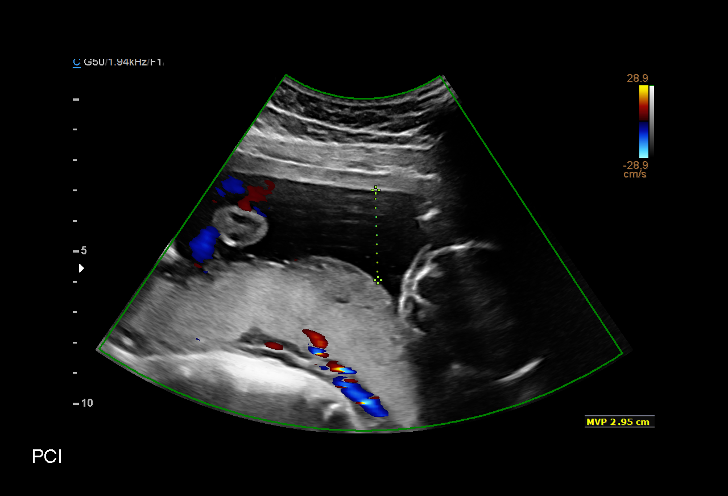
[im 90/106]
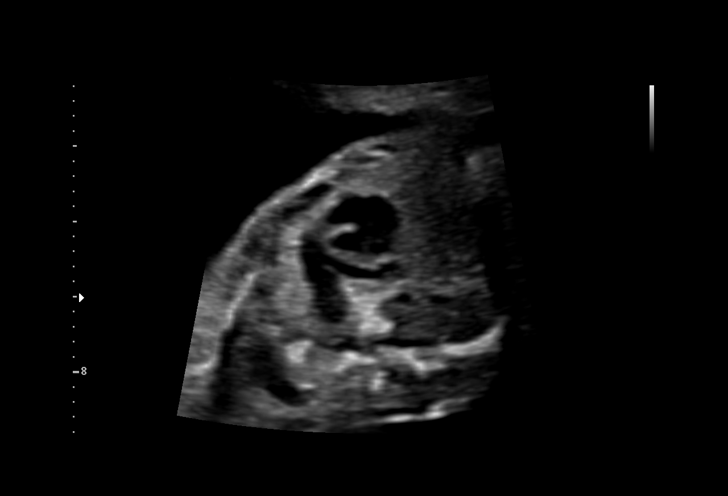
[im 98/106]
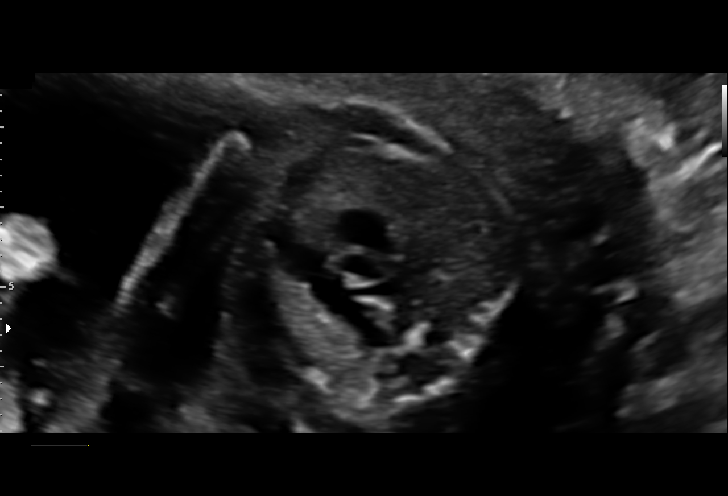
[im 106/106]
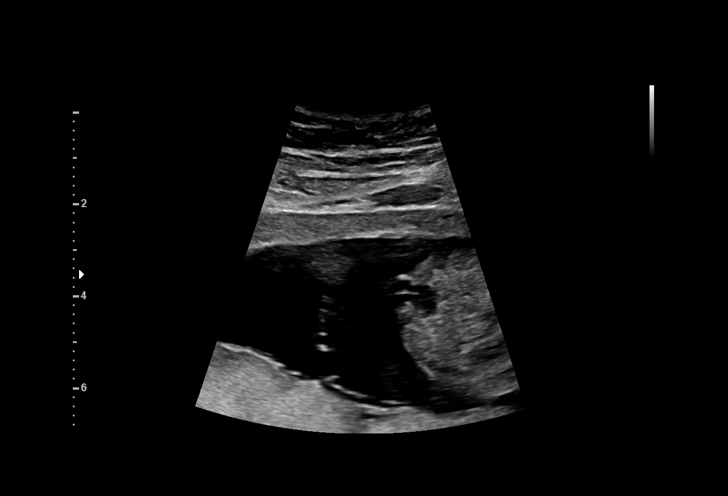

[14 of 28 positions shown; findings below may reference images not displayed]

MTHAKATHI

1  MTHAKATHI             [PHONE_NUMBER]      [PHONE_NUMBER]     [PHONE_NUMBER]
Indications

18 weeks gestation of pregnancy
Encounter for fetal anatomic survey            [MJ]
OB History

Blood Type:            Height:  5'1"   Weight (lb):  113       BMI:
Gravidity:    1
Fetal Evaluation

Num Of Fetuses:     1
Fetal Heart         138
Rate(bpm):
Cardiac Activity:   Observed
Presentation:       Cephalic
Placenta:           Posterior, above cervical os
P. Cord Insertion:  Visualized

Amniotic Fluid
AFI FV:      Subjectively within normal limits

Largest Pocket(cm)
2.95
Biometry

BPD:      42.5  mm     G. Age:  18w 6d         80  %    CI:        74.66   %    70 - 86
FL/HC:      18.8   %    15.8 - 18
HC:      156.1  mm     G. Age:  18w 4d         62  %    HC/AC:      1.20        1.07 -
AC:      130.6  mm     G. Age:  18w 4d         62  %    FL/BPD:     69.2   %
FL:       29.4  mm     G. Age:  19w 0d         76  %    FL/AC:      22.5   %    20 - 24
HUM:      28.1  mm     G. Age:  19w 0d         80  %
CER:      19.2  mm     G. Age:  18w 4d         64  %
NFT:       1.9  mm
CM:          6  mm

Est. FW:     257  gm      0 lb 9 oz     58  %
Gestational Age

LMP:           18w 1d        Date:  [DATE]                 EDD:   [DATE]
U/S Today:     18w 5d                                        EDD:   [DATE]
Best:          18w 1d     Det. By:  LMP  ([DATE])          EDD:   [DATE]
Anatomy

Cranium:               Appears normal         Aortic Arch:            Appears normal
Cavum:                 Appears normal         Ductal Arch:            Appears normal
Ventricles:            Appears normal         Diaphragm:              Appears normal
Choroid Plexus:        Appears normal         Stomach:                Appears normal, left
sided
Cerebellum:            Appears normal         Abdomen:                Appears normal
Posterior Fossa:       Appears normal         Abdominal Wall:         Appears nml (cord
insert, abd wall)
Nuchal Fold:           Appears normal         Cord Vessels:           Appears normal (3
vessel cord)
Face:                  Appears normal         Kidneys:                Appear normal
(orbits and profile)
Lips:                  Appears normal         Bladder:                Appears normal
Thoracic:              Appears normal         Spine:                  Limited views
appear normal
Heart:                 Appears normal         Upper Extremities:      Appears normal
(4CH, axis, and situs
RVOT:                  Appears normal         Lower Extremities:      Appears normal
LVOT:                  Appears normal

Other:  Parents do not wish to know sex of fetus today. Heels and 5th digit
visualized. Open hands visualized. Nasal bone visualized.
Cervix Uterus Adnexa

Cervix
Length:           3.17  cm.
Normal appearance by transabdominal scan.

Uterus
No abnormality visualized.

Left Ovary
Size(cm)       3.3  x   1.7    x  2.2       Vol(ml):
Within normal limits.

Right Ovary
Size(cm)        3   x   2.2    x  2.3       Vol(ml):
Within normal limits.
Impression

Singleton intrauterine pregnancy at 18+1 weeks here for
anatomic survey
Interval review of the anatomy shows no sonographic
markers for aneuploidy or structural anomalies
All relevant fetal anatomy has been visualized
Amniotic fluid volume is normal
Estimated fetal weight shows growth in the 58th percentile
Recommendations

Follow-up ultrasounds as clinically indicated.

## 2017-12-21 ENCOUNTER — Encounter: Payer: Self-pay | Admitting: Medical

## 2018-08-11 ENCOUNTER — Encounter (HOSPITAL_COMMUNITY): Payer: Self-pay
